# Patient Record
Sex: Male | Born: 1990 | Race: White | Hispanic: No | Marital: Single | Smoking: Current every day smoker
Health system: Southern US, Community
[De-identification: ages and names within clinical notes are randomized; demographics above are authoritative.]

---
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
## 2014-01-10 ENCOUNTER — Encounter (HOSPITAL_COMMUNITY): Payer: Self-pay | Admitting: Emergency Medicine

## 2014-01-10 ENCOUNTER — Emergency Department (HOSPITAL_COMMUNITY)
Admission: EM | Admit: 2014-01-10 | Discharge: 2014-01-10 | Disposition: A | Discharge status: Home / Self Care | Payer: Self-pay | Attending: Emergency Medicine | Admitting: Emergency Medicine

## 2014-01-10 DIAGNOSIS — Z720 Tobacco use: Secondary | ICD-10-CM | POA: Insufficient documentation

## 2014-01-10 DIAGNOSIS — K0381 Cracked tooth: Secondary | ICD-10-CM | POA: Insufficient documentation

## 2014-01-10 DIAGNOSIS — K088 Other specified disorders of teeth and supporting structures: Secondary | ICD-10-CM | POA: Insufficient documentation

## 2014-01-10 DIAGNOSIS — K0889 Other specified disorders of teeth and supporting structures: Secondary | ICD-10-CM

## 2014-01-10 MED ORDER — ACETAMINOPHEN-CODEINE #3 300-30 MG PO TABS
2.0000 | ORAL_TABLET | Freq: Once | ORAL | Status: AC
  Administered 2014-01-10: 2 via ORAL
  Filled 2014-01-10: qty 2

## 2014-01-10 MED ORDER — ACETAMINOPHEN-CODEINE #3 300-30 MG PO TABS: 1.0000 | ORAL_TABLET | Freq: Four times a day (QID) | ORAL | Status: DC | PRN

## 2014-01-10 MED ORDER — KETOROLAC TROMETHAMINE 10 MG PO TABS
10.0000 mg | ORAL_TABLET | Freq: Once | ORAL | Status: AC
  Administered 2014-01-10: 10 mg via ORAL
  Filled 2014-01-10: qty 1

## 2014-01-10 MED ORDER — IBUPROFEN 800 MG PO TABS: 800.0000 mg | ORAL_TABLET | Freq: Three times a day (TID) | ORAL | Status: AC

## 2014-01-10 MED ORDER — ONDANSETRON HCL 4 MG PO TABS
4.0000 mg | ORAL_TABLET | Freq: Once | ORAL | Status: AC
  Administered 2014-01-10: 4 mg via ORAL
  Filled 2014-01-10: qty 1

## 2014-01-10 MED ORDER — AMOXICILLIN 250 MG PO CAPS
500.0000 mg | ORAL_CAPSULE | Freq: Once | ORAL | Status: AC
  Administered 2014-01-10: 500 mg via ORAL
  Filled 2014-01-10: qty 2

## 2014-01-10 MED ORDER — AMOXICILLIN 500 MG PO CAPS: 500.0000 mg | ORAL_CAPSULE | Freq: Three times a day (TID) | ORAL | Status: DC

## 2014-01-10 NOTE — ED Notes (Signed)
Pt states he chipped a tooth a few days ago and c/o pain since then.

## 2014-01-10 NOTE — Discharge Instructions (Signed)
Dental Pain  Toothache is pain in or around a tooth. It may get worse with chewing or with cold or heat.   HOME CARE  · Your dentist may use a numbing medicine during treatment. If so, you may need to avoid eating until the medicine wears off. Ask your dentist about this.  · Only take medicine as told by your dentist or doctor.  · Avoid chewing food near the painful tooth until after all treatment is done. Ask your dentist about this.  GET HELP RIGHT AWAY IF:   · The problem gets worse or new problems appear.  · You have a fever.  · There is redness and puffiness (swelling) of the face, jaw, or neck.  · You cannot open your mouth.  · There is pain in the jaw.  · There is very bad pain that is not helped by medicine.  MAKE SURE YOU:   · Understand these instructions.  · Will watch your condition.  · Will get help right away if you are not doing well or get worse.  Document Released: 08/26/2007 Document Revised: 06/01/2011 Document Reviewed: 08/26/2007  [employer]® Patient Information ©2015 [employer]. This information is not intended to replace advice given to you by your health care provider. Make sure you discuss any questions you have with your health care provider.

## 2014-01-10 NOTE — ED Provider Notes (Signed)
CSN: [id number]     Arrival date & time 01/10/14  2140 History   First MD Initiated Contact with Patient 01/10/14 2233     Chief Complaint  Patient presents with  . Dental Pain     (Consider location/radiation/quality/duration/timing/severity/associated sxs/prior Treatment) Patient is a 23 y.o. male presenting with tooth pain. The history is provided by the patient.  Dental Pain Location:  Lower Lower teeth location:  21/LL 1st bicuspid Quality:  Aching and throbbing Severity:  Moderate Onset quality:  Gradual Duration:  2 days Timing:  Intermittent Progression:  Worsening Context: dental caries and dental fracture   Context comment:  Pt states he chipped a tooth on the left lower jaw Relieved by:  Nothing Worsened by:  Cold food/drink Associated symptoms: facial pain   Associated symptoms: no drooling, no fever and no neck pain   Risk factors: lack of dental care and smoking   Risk factors: no diabetes and no immunosuppression     History reviewed. No pertinent past medical history. History reviewed. No pertinent past surgical history. No family history on file. History  Substance Use Topics  . Smoking status: Current Every Day Smoker    Types: Cigarettes  . Smokeless tobacco: Not on file  . Alcohol Use: No    Review of Systems  Constitutional: Negative for fever and activity change.       All ROS Neg except as noted in HPI  HENT: Positive for dental problem. Negative for drooling.   Eyes: Negative for photophobia and discharge.  Respiratory: Negative for cough, shortness of breath and wheezing.   Cardiovascular: Negative for chest pain and palpitations.  Gastrointestinal: Negative for abdominal pain and blood in stool.  Genitourinary: Negative for dysuria, frequency and hematuria.  Musculoskeletal: Negative for arthralgias, back pain and neck pain.  Skin: Negative.   Neurological: Negative for dizziness, seizures and speech difficulty.  Psychiatric/Behavioral:  Negative for hallucinations and confusion.      Allergies  Review of patient's allergies indicates no known allergies.  Home Medications   Prior to Admission medications   Not on File   BP 126/57  Pulse 78  Temp(Src) 98.9 F (37.2 C) (Oral)  Resp 24  Ht 6\' 1"  (1.854 m)  Wt 180 lb (81.647 kg)  BMI 23.75 kg/m2  SpO2 99% Physical Exam  Nursing note and vitals reviewed. Constitutional: He is oriented to person, place, and time. He appears well-developed and well-nourished.  Non-toxic appearance.  HENT:  Head: Normocephalic.  Right Ear: Tympanic membrane and external ear normal.  Left Ear: Tympanic membrane and external ear normal.  Patient has multiple caries, but there is a chipped premolar of the left lower jaw. Pulp is not exposed. There is moderate swelling around the gum, but no visible abscess. There is no swelling under the tongue. The airway is patent.  Eyes: EOM and lids are normal. Pupils are equal, round, and reactive to light.  Neck: Normal range of motion. Neck supple. Carotid bruit is not present.  Cardiovascular: Normal rate, regular rhythm, normal heart sounds, intact distal pulses and normal pulses.   Pulmonary/Chest: Breath sounds normal. No respiratory distress.  Abdominal: Soft. Bowel sounds are normal. There is no tenderness. There is no guarding.  Musculoskeletal: Normal range of motion.  Lymphadenopathy:       Head (right side): No submandibular adenopathy present.       Head (left side): No submandibular adenopathy present.    He has no cervical adenopathy.  Neurological: He is alert and  oriented to person, place, and time. He has normal strength. No cranial nerve deficit or sensory deficit.  Skin: Skin is warm and dry.  Psychiatric: He has a normal mood and affect. His speech is normal.    ED Course  Procedures (including critical care time) Labs Review Labs Reviewed - No data to display  Imaging Review No results found.   EKG  Interpretation None      MDM  Pt has a chipped tooth on the left lower jaw area. Vital signs stable Rx for tylenol codeine, amoxil and ibuprofen given to the patient. Pt strongly encouraged to see a dentist as soon as possible.   Final diagnoses:  Toothache    **I have reviewed nursing notes, vital signs, and all appropriate lab and imaging results for this patient.Kathie Dike*    Rashauna Tep M Lesette Frary, PA-C 01/11/14 [phone]

## 2014-01-12 NOTE — ED Provider Notes (Signed)
Medical screening examination/treatment/procedure(s) were performed by non-physician practitioner and as supervising physician I was immediately available for consultation/collaboration.    Vida RollerBrian D Glendon Dunwoody, MD 01/12/14 [phone]

## 2014-04-24 ENCOUNTER — Encounter (HOSPITAL_COMMUNITY): Payer: Self-pay

## 2014-04-24 ENCOUNTER — Emergency Department (HOSPITAL_COMMUNITY)
Admission: EM | Admit: 2014-04-24 | Discharge: 2014-04-24 | Disposition: A | Discharge status: Home / Self Care | Payer: Self-pay | Attending: Emergency Medicine | Admitting: Emergency Medicine

## 2014-04-24 ENCOUNTER — Emergency Department (HOSPITAL_COMMUNITY): Payer: Self-pay

## 2014-04-24 DIAGNOSIS — W2201XA Walked into wall, initial encounter: Secondary | ICD-10-CM | POA: Insufficient documentation

## 2014-04-24 DIAGNOSIS — Y9389 Activity, other specified: Secondary | ICD-10-CM | POA: Insufficient documentation

## 2014-04-24 DIAGNOSIS — Y998 Other external cause status: Secondary | ICD-10-CM | POA: Insufficient documentation

## 2014-04-24 DIAGNOSIS — Z792 Long term (current) use of antibiotics: Secondary | ICD-10-CM | POA: Insufficient documentation

## 2014-04-24 DIAGNOSIS — Y9289 Other specified places as the place of occurrence of the external cause: Secondary | ICD-10-CM | POA: Insufficient documentation

## 2014-04-24 DIAGNOSIS — Z720 Tobacco use: Secondary | ICD-10-CM | POA: Insufficient documentation

## 2014-04-24 DIAGNOSIS — L03114 Cellulitis of left upper limb: Secondary | ICD-10-CM | POA: Insufficient documentation

## 2014-04-24 DIAGNOSIS — R000 Tachycardia, unspecified: Secondary | ICD-10-CM | POA: Insufficient documentation

## 2014-04-24 DIAGNOSIS — Z791 Long term (current) use of non-steroidal anti-inflammatories (NSAID): Secondary | ICD-10-CM | POA: Insufficient documentation

## 2014-04-24 MED ORDER — SULFAMETHOXAZOLE-TRIMETHOPRIM 800-160 MG PO TABS: 1.0000 | ORAL_TABLET | Freq: Two times a day (BID) | ORAL | Status: DC

## 2014-04-24 MED ORDER — KETOROLAC TROMETHAMINE 60 MG/2ML IM SOLN
60.0000 mg | Freq: Once | INTRAMUSCULAR | Status: AC
  Administered 2014-04-24: 60 mg via INTRAMUSCULAR
  Filled 2014-04-24: qty 2

## 2014-04-24 MED ORDER — NAPROXEN 500 MG PO TABS: 500.0000 mg | ORAL_TABLET | Freq: Two times a day (BID) | ORAL | Status: DC

## 2014-04-24 MED ORDER — CEPHALEXIN 500 MG PO CAPS: 500.0000 mg | ORAL_CAPSULE | Freq: Three times a day (TID) | ORAL | Status: DC
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
## 2014-04-24 NOTE — Discharge Instructions (Signed)

## 2014-04-26 ENCOUNTER — Encounter (HOSPITAL_COMMUNITY): Payer: Self-pay | Admitting: *Deleted

## 2014-04-26 ENCOUNTER — Inpatient Hospital Stay (HOSPITAL_COMMUNITY)
Admission: EM | Admit: 2014-04-26 | Discharge: 2014-04-29 | DRG: 603 | Disposition: A | Discharge status: Home / Self Care | Payer: Self-pay | Attending: Internal Medicine | Admitting: Internal Medicine

## 2014-04-26 DIAGNOSIS — Z792 Long term (current) use of antibiotics: Secondary | ICD-10-CM

## 2014-04-26 DIAGNOSIS — L03114 Cellulitis of left upper limb: Principal | ICD-10-CM | POA: Diagnosis present

## 2014-04-26 DIAGNOSIS — L02414 Cutaneous abscess of left upper limb: Secondary | ICD-10-CM | POA: Diagnosis present

## 2014-04-26 DIAGNOSIS — R000 Tachycardia, unspecified: Secondary | ICD-10-CM | POA: Diagnosis present

## 2014-04-26 DIAGNOSIS — E876 Hypokalemia: Secondary | ICD-10-CM | POA: Diagnosis present

## 2014-04-26 DIAGNOSIS — Z791 Long term (current) use of non-steroidal anti-inflammatories (NSAID): Secondary | ICD-10-CM

## 2014-04-26 DIAGNOSIS — D696 Thrombocytopenia, unspecified: Secondary | ICD-10-CM | POA: Diagnosis present

## 2014-04-26 DIAGNOSIS — F17210 Nicotine dependence, cigarettes, uncomplicated: Secondary | ICD-10-CM | POA: Diagnosis present

## 2014-04-26 DIAGNOSIS — F1910 Other psychoactive substance abuse, uncomplicated: Secondary | ICD-10-CM

## 2014-04-26 LAB — CBC WITH DIFFERENTIAL/PLATELET
Basophils Absolute: 0 10*3/uL (ref 0.0–0.1)
Basophils Relative: 0 % (ref 0–1)
Eosinophils Absolute: 0.5 10*3/uL (ref 0.0–0.7)
Eosinophils Relative: 3 % (ref 0–5)
HCT: 40.5 % (ref 39.0–52.0)
Hemoglobin: 13.8 g/dL (ref 13.0–17.0)
Lymphocytes Relative: 6 % — ABNORMAL LOW (ref 12–46)
Lymphs Abs: 0.8 10*3/uL (ref 0.7–4.0)
MCH: 30.2 pg (ref 26.0–34.0)
MCHC: 34.1 g/dL (ref 30.0–36.0)
MCV: 88.6 fL (ref 78.0–100.0)
Monocytes Absolute: 0.4 10*3/uL (ref 0.1–1.0)
Monocytes Relative: 3 % (ref 3–12)
Neutro Abs: 12.5 10*3/uL — ABNORMAL HIGH (ref 1.7–7.7)
Neutrophils Relative %: 88 % — ABNORMAL HIGH (ref 43–77)
Platelets: 149 10*3/uL — ABNORMAL LOW (ref 150–400)
RBC: 4.57 MIL/uL (ref 4.22–5.81)
RDW: 14 % (ref 11.5–15.5)
WBC: 14.2 10*3/uL — ABNORMAL HIGH (ref 4.0–10.5)

## 2014-04-26 LAB — BASIC METABOLIC PANEL
Anion gap: 8 (ref 5–15)
BUN: 18 mg/dL (ref 6–23)
CO2: 26 mmol/L (ref 19–32)
Calcium: 8.3 mg/dL — ABNORMAL LOW (ref 8.4–10.5)
Chloride: 103 mmol/L (ref 96–112)
Creatinine, Ser: 1.32 mg/dL (ref 0.50–1.35)
GFR calc Af Amer: 87 mL/min — ABNORMAL LOW (ref >90)
GFR calc non Af Amer: 75 mL/min — ABNORMAL LOW (ref >90)
Glucose, Bld: 152 mg/dL — ABNORMAL HIGH (ref 70–99)
Potassium: 3.4 mmol/L — ABNORMAL LOW (ref 3.5–5.1)
Sodium: 137 mmol/L (ref 135–145)

## 2014-04-26 MED ORDER — POVIDONE-IODINE 10 % EX SOLN
CUTANEOUS | Status: AC
  Filled 2014-04-26: qty 118

## 2014-04-26 MED ORDER — PIPERACILLIN-TAZOBACTAM 3.375 G IVPB 30 MIN
3.3750 g | Freq: Once | INTRAVENOUS | Status: AC
  Administered 2014-04-26: 3.375 g via INTRAVENOUS
  Filled 2014-04-26: qty 50

## 2014-04-26 MED ORDER — BUPIVACAINE HCL (PF) 0.25 % IJ SOLN
10.0000 mL | Freq: Once | INTRAMUSCULAR | Status: AC
  Administered 2014-04-27: 10 mL
  Filled 2014-04-26: qty 30

## 2014-04-26 MED ORDER — ONDANSETRON HCL 4 MG/2ML IJ SOLN
4.0000 mg | Freq: Once | INTRAMUSCULAR | Status: AC
  Administered 2014-04-26: 4 mg via INTRAMUSCULAR
  Filled 2014-04-26: qty 2

## 2014-04-26 MED ORDER — HYDROMORPHONE HCL 1 MG/ML IJ SOLN
1.0000 mg | Freq: Once | INTRAMUSCULAR | Status: AC
  Administered 2014-04-26: 1 mg via INTRAVENOUS
  Filled 2014-04-26: qty 1

## 2014-04-26 MED ORDER — VANCOMYCIN HCL 10 G IV SOLR
1500.0000 mg | Freq: Once | INTRAVENOUS | Status: AC
  Administered 2014-04-26: 1500 mg via INTRAVENOUS
  Filled 2014-04-26: qty 1500

## 2014-04-26 NOTE — ED Notes (Signed)
MD at bedside. 
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
## 2014-04-27 ENCOUNTER — Encounter (HOSPITAL_COMMUNITY): Payer: Self-pay | Admitting: *Deleted

## 2014-04-27 DIAGNOSIS — L03113 Cellulitis of right upper limb: Secondary | ICD-10-CM | POA: Insufficient documentation

## 2014-04-27 DIAGNOSIS — F1910 Other psychoactive substance abuse, uncomplicated: Secondary | ICD-10-CM | POA: Diagnosis present

## 2014-04-27 DIAGNOSIS — L0291 Cutaneous abscess, unspecified: Secondary | ICD-10-CM | POA: Insufficient documentation

## 2014-04-27 DIAGNOSIS — L0390 Cellulitis, unspecified: Secondary | ICD-10-CM

## 2014-04-27 DIAGNOSIS — L02414 Cutaneous abscess of left upper limb: Secondary | ICD-10-CM

## 2014-04-27 DIAGNOSIS — L03114 Cellulitis of left upper limb: Secondary | ICD-10-CM | POA: Diagnosis present

## 2014-04-27 DIAGNOSIS — R7881 Bacteremia: Secondary | ICD-10-CM

## 2014-04-27 LAB — HEPATIC FUNCTION PANEL
ALBUMIN: 2.8 g/dL — AB (ref 3.5–5.2)
ALT: 45 U/L (ref 0–53)
AST: 28 U/L (ref 0–37)
Alkaline Phosphatase: 87 U/L (ref 39–117)
Bilirubin, Direct: 0.2 mg/dL (ref 0.0–0.5)
Indirect Bilirubin: 0.2 mg/dL — ABNORMAL LOW (ref 0.3–0.9)
Total Bilirubin: 0.4 mg/dL (ref 0.3–1.2)
Total Protein: 6 g/dL (ref 6.0–8.3)

## 2014-04-27 LAB — CBC
HCT: 34.7 % — ABNORMAL LOW (ref 39.0–52.0)
HEMOGLOBIN: 11.9 g/dL — AB (ref 13.0–17.0)
MCH: 30.4 pg (ref 26.0–34.0)
MCHC: 34.3 g/dL (ref 30.0–36.0)
MCV: 88.5 fL (ref 78.0–100.0)
PLATELETS: 143 10*3/uL — AB (ref 150–400)
RBC: 3.92 MIL/uL — ABNORMAL LOW (ref 4.22–5.81)
RDW: 14 % (ref 11.5–15.5)
WBC: 11.5 10*3/uL — AB (ref 4.0–10.5)

## 2014-04-27 LAB — BASIC METABOLIC PANEL
Anion gap: 5 (ref 5–15)
BUN: 16 mg/dL (ref 6–23)
CALCIUM: 7.9 mg/dL — AB (ref 8.4–10.5)
CO2: 24 mmol/L (ref 19–32)
Chloride: 103 mmol/L (ref 96–112)
Creatinine, Ser: 1.2 mg/dL (ref 0.50–1.35)
GFR calc Af Amer: >90 mL/min (ref >90)
GFR calc non Af Amer: 84 mL/min — ABNORMAL LOW (ref >90)
Glucose, Bld: 135 mg/dL — ABNORMAL HIGH (ref 70–99)
Potassium: 3.1 mmol/L — ABNORMAL LOW (ref 3.5–5.1)
SODIUM: 132 mmol/L — AB (ref 135–145)

## 2014-04-27 MED ORDER — SODIUM CHLORIDE 0.9 % IV SOLN
250.0000 mL | INTRAVENOUS | Status: DC | PRN
  Administered 2014-04-27: 250 mL via INTRAVENOUS

## 2014-04-27 MED ORDER — VANCOMYCIN HCL IN DEXTROSE 750-5 MG/150ML-% IV SOLN
750.0000 mg | Freq: Once | INTRAVENOUS | Status: DC
  Filled 2014-04-27: qty 150

## 2014-04-27 MED ORDER — HYDROMORPHONE HCL 2 MG/ML IJ SOLN
2.0000 mg | Freq: Once | INTRAMUSCULAR | Status: AC
  Administered 2014-04-27: 2 mg via INTRAVENOUS
  Filled 2014-04-27: qty 1

## 2014-04-27 MED ORDER — ONDANSETRON HCL 4 MG/2ML IJ SOLN: 4.0000 mg | Freq: Four times a day (QID) | INTRAMUSCULAR | Status: DC | PRN

## 2014-04-27 MED ORDER — PIPERACILLIN-TAZOBACTAM 3.375 G IVPB
3.3750 g | Freq: Three times a day (TID) | INTRAVENOUS | Status: DC
  Administered 2014-04-27 – 2014-04-29 (×7): 3.375 g via INTRAVENOUS
  Filled 2014-04-27 (×11): qty 50

## 2014-04-27 MED ORDER — SODIUM CHLORIDE 0.9 % IJ SOLN
3.0000 mL | Freq: Two times a day (BID) | INTRAMUSCULAR | Status: DC
  Administered 2014-04-27 – 2014-04-28 (×3): 3 mL via INTRAVENOUS

## 2014-04-27 MED ORDER — SODIUM CHLORIDE 0.9 % IJ SOLN
3.0000 mL | INTRAMUSCULAR | Status: DC | PRN
  Administered 2014-04-27: 3 mL via INTRAVENOUS
  Filled 2014-04-27: qty 3

## 2014-04-27 MED ORDER — ONDANSETRON HCL 4 MG PO TABS: 4.0000 mg | ORAL_TABLET | Freq: Four times a day (QID) | ORAL | Status: DC | PRN

## 2014-04-27 MED ORDER — ACETAMINOPHEN 325 MG PO TABS
650.0000 mg | ORAL_TABLET | ORAL | Status: DC | PRN
  Administered 2014-04-27 – 2014-04-28 (×4): 650 mg via ORAL
  Filled 2014-04-27 (×3): qty 2

## 2014-04-27 MED ORDER — VANCOMYCIN HCL IN DEXTROSE 1-5 GM/200ML-% IV SOLN
1000.0000 mg | Freq: Two times a day (BID) | INTRAVENOUS | Status: DC
  Administered 2014-04-27 – 2014-04-29 (×4): 1000 mg via INTRAVENOUS
  Filled 2014-04-27 (×7): qty 200

## 2014-04-27 MED ORDER — ENOXAPARIN SODIUM 40 MG/0.4ML ~~LOC~~ SOLN
40.0000 mg | SUBCUTANEOUS | Status: DC
  Filled 2014-04-27 (×2): qty 0.4

## 2014-04-27 MED ORDER — HYDROMORPHONE HCL 1 MG/ML IJ SOLN
1.0000 mg | INTRAMUSCULAR | Status: DC | PRN
  Administered 2014-04-27 – 2014-04-29 (×17): 1 mg via INTRAVENOUS
  Filled 2014-04-27 (×18): qty 1

## 2014-04-27 MED ORDER — PIPERACILLIN-TAZOBACTAM 3.375 G IVPB
INTRAVENOUS | Status: AC
  Filled 2014-04-27: qty 50

## 2014-04-27 MED ORDER — POTASSIUM CHLORIDE CRYS ER 20 MEQ PO TBCR
40.0000 meq | EXTENDED_RELEASE_TABLET | Freq: Once | ORAL | Status: AC
  Administered 2014-04-27: 40 meq via ORAL
  Filled 2014-04-27: qty 2

## 2014-04-27 NOTE — Care Management Note (Signed)
    Page 1 of 1   04/27/2014     12:56:53 PM CARE MANAGEMENT NOTE 04/27/2014  Patient:  Isac SarnaOTTS,Wacey D   Account Number:  [id number]  Date Initiated:  04/27/2014  Documentation initiated by:  Kathyrn SheriffHILDRESS,JESSICA  Subjective/Objective Assessment:   Pt is from home, admited with cellulitis. Pt is uninsured and [facility]. Pt plans to discharge home with self care. Follow up appontment made at the [facility].     Action/Plan:   Referral made to financial counselor. Pt states he does not need medication assistance.  No further CM needs at this time.   Anticipated DC Date:  04/30/2014   Anticipated DC Plan:  HOME/SELF CARE  In-house referral  Financial Counselor      DC Planning Services  CM consult      Choice offered to / List presented to:             Status of service:  Completed, signed off Medicare Important Message given?   (If response is "NO", the following Medicare IM given date fields will be blank) Date Medicare IM given:   Medicare IM given by:   Date Additional Medicare IM given:   Additional Medicare IM given by:    Discharge Disposition:  HOME/SELF CARE  Per UR Regulation:    If discussed at Long Length of Stay Meetings, dates discussed:    Comments:  04/27/2014 1245 Kathyrn SheriffJessica Childress, RN, MSN, CM

## 2014-04-27 NOTE — H&P (Signed)
PCP:   No PCP Per Patient   Chief Complaint:  Left hand swelling  HPI: 24 yo male h/o iv drug abuse sober for past year comes in after hitting the wall with his left hand 2 days ago, now it is swelling left hand and forearm and very red and painful.  No fevers.  Had an abrasion over his 5th knuckle from hitting the wall.  Has cellulitis and absess on the forearm which has been drained by ER doctor.  Was seen about a day ago and placed on bactrim and keflex, but has worsened despite oral abx.  Review of Systems:  Positive and negative as per HPI otherwise all other systems are negative  Past Medical History: History reviewed. No pertinent past medical history. History reviewed. No pertinent past surgical history.  Medications: Prior to Admission medications   Medication Sig Start Date End Date Taking? Authorizing Provider  cephALEXin (KEFLEX) 500 MG capsule Take 1 capsule (500 mg total) by mouth 3 (three) times daily. 04/24/14  Yes Arthor Captain, PA-C  ibuprofen (ADVIL,MOTRIN) 800 MG tablet Take 1 tablet (800 mg total) by mouth 3 (three) times daily. 01/10/14  Yes Kathie Dike, PA-C  naproxen (NAPROSYN) 500 MG tablet Take 1 tablet (500 mg total) by mouth 2 (two) times daily with a meal. 04/24/14  Yes Arthor Captain, PA-C  sulfamethoxazole-trimethoprim (SEPTRA DS) 800-160 MG per tablet Take 1 tablet by mouth every 12 (twelve) hours. 04/24/14  Yes Arthor Captain, PA-C  acetaminophen-codeine (TYLENOL #3) 300-30 MG per tablet Take 1-2 tablets by mouth every 6 (six) hours as needed. Patient not taking: Reported on 04/24/2014 01/10/14   Kathie Dike, PA-C  amoxicillin (AMOXIL) 500 MG capsule Take 1 capsule (500 mg total) by mouth 3 (three) times daily. Patient not taking: Reported on 04/24/2014 01/10/14   Kathie Dike, PA-C    Allergies:   Allergies  Allergen Reactions  . Hydrocodone Nausea Only and Rash  . Tramadol Nausea Only and Rash    Social History:  reports that he has been  smoking Cigarettes.  He does not have any smokeless tobacco history on file. He reports that he does not drink alcohol or use illicit drugs.  Family History: History reviewed. No pertinent family history.  Physical Exam: Filed Vitals:   04/26/14 1628 04/26/14 1916 04/26/14 2349  BP: 134/83 125/67 125/75  Pulse: 120 107 113  Temp: 98.8 F (37.1 C) 100.1 F (37.8 C) 99 F (37.2 C)  TempSrc: Oral Oral Oral  Resp: Height:  (1.854 m)    Weight: 81.647 kg (180 lb)    SpO2: 100% 100% 98%   General appearance: alert, cooperative and no distress Head: Normocephalic, without obvious abnormality, atraumatic Eyes: negative Nose: Nares normal. Septum midline. Mucosa normal. No drainage or sinus tenderness. Neck: no JVD and supple, symmetrical, trachea midline Lungs: clear to auscultation bilaterally Heart: regular rate and rhythm, S1, S2 normal, no murmur, click, rub or gallop Abdomen: soft, non-tender; bowel sounds normal; no masses,  no organomegaly Extremities: extremities normal, atraumatic, no cyanosis or edema  Left hand swollen and forearm swollen with erythema c/w cellulitis, absess to mid forearm drained Pulses: 2+ and symmetric Skin: Skin color, texture, turgor normal. No rashes or lesions Neurologic: Grossly normal    Labs on Admission:   Recent Labs  04/26/14 1637  NA 137  K 3.4*  CL 103  CO2 26  GLUCOSE 152*  BUN 18  CREATININE 1.32  CALCIUM 8.3*  Recent Labs  04/26/14 1637  WBC 14.2*  NEUTROABS 12.5*  HGB 13.8  HCT 40.5  MCV 88.6  PLT 149*    Radiological Exams on Admission: Dg Forearm Left  04/24/2014   CLINICAL DATA:  Left wrist injury, punched a brick wall last night  EXAM: LEFT FOREARM - 2 VIEW  COMPARISON:  None.  FINDINGS: Two views of the left forearm submitted. No acute fracture or subluxation. No radiopaque foreign body.  IMPRESSION: Negative.   Electronically Signed   By: Natasha MeadLiviu  Pop M.D.   On: 04/24/2014 11:49   Dg Wrist  Complete Left  04/24/2014   CLINICAL DATA:  Wrist injury, punched a brick wall last night  EXAM: LEFT WRIST - COMPLETE 3+ VIEW  COMPARISON:  None.  FINDINGS: Four views of the left wrist submitted. No acute fracture or subluxation. No radiopaque foreign body. Probable old fracture of the tip of ulnar styloid.  IMPRESSION: No acute fracture or subluxation.  No radiopaque foreign body.   Electronically Signed   By: Natasha MeadLiviu  Pop M.D.   On: 04/24/2014 11:49    Assessment/Plan  24 yo male with left hand/forearm cellulitis/absess  Principal Problem:   Abscess of forearm, left-  Iv vanco and zosyn.  Wound cx sent by dr Juleen Chinakohut.  Should improve over next 24 to 48 hours with iv abx.  Prn pain meds as needed.  Active Problems:   h/o Drug abuse, IV-  Denies any recent ivda for a year.   Cellulitis of hand, left   Cellulitis of forearm, left  Admit to med surg.  Full code.  Kammy Klett A 04/27/2014, 1:27 AM

## 2014-04-27 NOTE — Progress Notes (Signed)
Pt requesting current dressing be changed to R arm wound.  Michael ShepherdM Lynch NP notified with no current orders in place for wound care.  Order placed for Wet to dry dressing changes BID.  Will change dressing per order.

## 2014-04-27 NOTE — Plan of Care (Signed)
Problem: Consults Goal: General Medical Patient Education See Patient Education Module for specific education. Outcome: Progressing Admitted with erythema and swelling to left forearm.  Penmarked and I&D'd in the ED.  Small gauze to site. Hand is also swollen and red and warm. Patient has temperature of 100.2 Goal: Skin Care Protocol Initiated - if Braden Score 18 or less If consults are not indicated, leave blank or document N/A Outcome: Progressing No drainage noted at gauze site, purulent drainage per report from ED, none noted  Problem: Phase I Progression Outcomes Goal: Pain controlled with appropriate interventions Outcome: Progressing Pain controlled with dilaudid IV Goal: OOB as tolerated unless otherwise ordered Outcome: Progressing Patient is oob and using the bathroom prn Goal: Initial discharge plan identified Outcome: Progressing [place]

## 2014-04-27 NOTE — Care Management Utilization Note (Signed)
UR completed 

## 2014-04-27 NOTE — Progress Notes (Signed)
  Echocardiogram 2D Echocardiogram has been performed.  Leta JunglingCooper, Cortland Crehan M 04/27/2014, 2:47 PM

## 2014-04-27 NOTE — Progress Notes (Addendum)
ANTIBIOTIC CONSULT NOTE - INITIAL  Pharmacy Consult for vancomycin & Zosyn Indication: cellulitis of left hand and left forearm  Allergies  Allergen Reactions  . Hydrocodone Nausea Only and Rash  . Tramadol Nausea Only and Rash    Patient Measurements: Height: 6\' 1"  (185.4 cm) Weight: 179 lb 14.3 oz (81.6 kg) IBW/kg (Calculated) : 79.9   Vital Signs: Temp: 100.2 F (37.9 C) (02/05 0148) Temp Source: Oral (02/05 0148) BP: 125/70 mmHg (02/05 0148) Pulse Rate: 112 (02/05 0148) Intake/Output from previous day:   Intake/Output from this shift:    Labs:  Recent Labs  04/26/14 1637  WBC 14.2*  HGB 13.8  PLT 149*  CREATININE 1.32   Estimated Creatinine Clearance: 98.4 mL/min (by C-G formula based on Cr of 1.32). No results for input(s): VANCOTROUGH, VANCOPEAK, VANCORANDOM, GENTTROUGH, GENTPEAK, GENTRANDOM, TOBRATROUGH, TOBRAPEAK, TOBRARND, AMIKACINPEAK, AMIKACINTROU, AMIKACIN in the last 72 hours.   Microbiology: No results found for this or any previous visit (from the past 720 hour(s)).  Medical History: History reviewed. No pertinent past medical history.  Medications:  Scheduled:  . enoxaparin (LOVENOX) injection  40 mg Subcutaneous Q24H  . piperacillin-tazobactam (ZOSYN)  IV  3.375 g Intravenous 3 times per day  . povidone-iodine      . sodium chloride  3 mL Intravenous Q12H  . vancomycin  1,000 mg Intravenous Q12H  . vancomycin  750 mg Intravenous Once   Infusions:   PRN: sodium chloride, HYDROmorphone (DILAUDID) injection, ondansetron **OR** ondansetron (ZOFRAN) IV, sodium chloride   Assessment: Young male treated for wound & cellulitis of left hand & forearm with Keflex since Feb 2nd.  Now to be treated with IV vancomycin & Zosyn.  Goal of Therapy:  Standard regimen for Zosyn with CrCl>3620ml/min.  Desire vancomycin trough level to be 12-9515mcg/ml for cellulitis.  Plan:  1.  Vancomycin 750mg  IV x 1 more dose now (in addition to 1gm dose given 2300 last  evening 2. Then maintenance regimen vancomycin 1gm IV q12h 3.  Zosyn 3.375gm IV q8h (4hr infusions) 4.  Monitor for indices of infection and renal function 5.  Measure actual vancomycin steady state trough level as clinically indicated  Tiler Brandis, Lloyd HugerNeil E 04/27/2014,1:54 AM   Addendum: Noted that patient actually received Vancomycin 1500mg  IV x 1 earlier this evening.  Therefore, will not give the 750mg  additional loading dose.

## 2014-04-27 NOTE — Progress Notes (Signed)
[facility] PROGRESS NOTE  Michael Benitez ZOX:[id number] DOB: 02-03-1991 DOA: 04/26/2014 PCP: No PCP Per Patient Interim summary: 24 yo male with prior h/o IV drug abuse, comes in for left forearm and hand swelling. He was seen by EDP a day ago and started on keflex and bactrim,. His symptoms have worsened since then and he was back last night for the same symptoms. He was admitted for left forearm cellulitis and abscess. He underwent I&D of the left forearm and started on broad spectrum antibiotics.  Currently he is febrile and tachycardic.   Assessment/Plan: 1. Left arm cellulitis and abscess s/p I& D by EDP and wound cultures sent. Currently on IV vancomycin and Iv ZOSYN. Pain control with dilaudid .   Hypokalemia: replete as needed.   Mild leukocytosis: From the left arm cellulitis .   Mild thrombocytopenia.   Tachycardia: Possibly from the pain and infection.    Code Status: full code.  Family Communication: family at bedside Disposition Plan: home when the cellulitis improves   Consultants: ? Surgery by EDP Procedures:  I&d 2/4  Antibiotics: IV vancomycin  IV zosyn 2/4 HPI/Subjective: Wants to know when he can go home. Reports pain is not well controlled.   Objective: Filed Vitals:   04/27/14 0806  BP: 111/45  Pulse: 129  Temp: 102.3 F (39.1 C)  Resp:    No intake or output data in the 24 hours ending 04/27/14 1009 Filed Weights   04/26/14 1628 04/27/14 0148  Weight: 81.647 kg (180 lb) 81.6 kg (179 lb 14.3 oz)    Exam:   General:  Alert, febrile in mild distress from pain  Cardiovascular: s1s2, tachycardic. No mrg.   Respiratory: clear to ausculation, no wheezing heard  Abdomen: soft non tender not distended bowel sounds heard  Musculoskeletal: left fore arm and hand swollen and erythematous.   Data Reviewed: Basic Metabolic Panel:  Recent Labs Lab 04/26/14 1637 04/27/14 0611  NA 137 132*  K 3.4* 3.1*  CL 103 103  CO2 26 24   GLUCOSE 152* 135*  BUN 18 16  CREATININE 1.32 1.20  CALCIUM 8.3* 7.9*   Liver Function Tests: No results for input(s): AST, ALT, ALKPHOS, BILITOT, PROT, ALBUMIN in the last 168 hours. No results for input(s): LIPASE, AMYLASE in the last 168 hours. No results for input(s): AMMONIA in the last 168 hours. CBC:  Recent Labs Lab 04/26/14 1637 04/27/14 0611  WBC 14.2* 11.5*  NEUTROABS 12.5*  --   HGB 13.8 11.9*  HCT 40.5 34.7*  MCV 88.6 88.5  PLT 149* 143*   Cardiac Enzymes: No results for input(s): CKTOTAL, CKMB, CKMBINDEX, TROPONINI in the last 168 hours. BNP (last 3 results) No results for input(s): BNP in the last 8760 hours.  ProBNP (last 3 results) No results for input(s): PROBNP in the last 8760 hours.  CBG: No results for input(s): GLUCAP in the last 168 hours.  No results found for this or any previous visit (from the past 240 hour(s)).   Studies: No results found.  Scheduled Meds: . enoxaparin (LOVENOX) injection  40 mg Subcutaneous Q24H  . piperacillin-tazobactam (ZOSYN)  IV  3.375 g Intravenous 3 times per day  . povidone-iodine      . sodium chloride  3 mL Intravenous Q12H  . vancomycin  1,000 mg Intravenous Q12H   Continuous Infusions:   Principal Problem:   Abscess of forearm, left Active Problems:   h/o Drug abuse, IV   Cellulitis of hand, left  Cellulitis of forearm, left    Time spent: 20 minutes    Rajat Staver  [facility] Pager [phone] If 7PM-7AM, please contact night-coverage at [email or web address], password [email or web address] 04/27/2014, 10:09 AM  LOS: 1 day

## 2014-04-28 LAB — BASIC METABOLIC PANEL
Anion gap: 7 (ref 5–15)
BUN: 15 mg/dL (ref 6–23)
CO2: 24 mmol/L (ref 19–32)
Calcium: 8.2 mg/dL — ABNORMAL LOW (ref 8.4–10.5)
Chloride: 103 mmol/L (ref 96–112)
Creatinine, Ser: 1 mg/dL (ref 0.50–1.35)
GFR calc Af Amer: >90 mL/min (ref >90)
GFR calc non Af Amer: >90 mL/min (ref >90)
Glucose, Bld: 108 mg/dL — ABNORMAL HIGH (ref 70–99)
Potassium: 3.3 mmol/L — ABNORMAL LOW (ref 3.5–5.1)
Sodium: 134 mmol/L — ABNORMAL LOW (ref 135–145)

## 2014-04-28 LAB — CBC
HEMATOCRIT: 33.9 % — AB (ref 39.0–52.0)
HEMOGLOBIN: 11.4 g/dL — AB (ref 13.0–17.0)
MCH: 30.2 pg (ref 26.0–34.0)
MCHC: 33.6 g/dL (ref 30.0–36.0)
MCV: 89.7 fL (ref 78.0–100.0)
Platelets: 162 10*3/uL (ref 150–400)
RBC: 3.78 MIL/uL — ABNORMAL LOW (ref 4.22–5.81)
RDW: 14.2 % (ref 11.5–15.5)
WBC: 9.5 10*3/uL (ref 4.0–10.5)

## 2014-04-28 MED ORDER — POTASSIUM CHLORIDE CRYS ER 20 MEQ PO TBCR
40.0000 meq | EXTENDED_RELEASE_TABLET | Freq: Two times a day (BID) | ORAL | Status: AC
  Administered 2014-04-28 (×2): 40 meq via ORAL
  Filled 2014-04-28 (×2): qty 2

## 2014-04-28 MED ORDER — OXYCODONE HCL 5 MG PO TABS
5.0000 mg | ORAL_TABLET | Freq: Four times a day (QID) | ORAL | Status: DC | PRN
  Administered 2014-04-29 (×2): 5 mg via ORAL
  Filled 2014-04-28 (×2): qty 1

## 2014-04-28 NOTE — Plan of Care (Signed)
Problem: Phase II Progression Outcomes Goal: Progress activity as tolerated unless otherwise ordered Outcome: Completed/Met Date Met:  04/28/14 Pt ambulating [place] tonight with steady gate Goal: IV changed to normal saline lock Outcome: Not Progressing Pt on IV antibiotics at this time

## 2014-04-28 NOTE — Progress Notes (Signed)
[facility] PROGRESS NOTE  Isac SarnaCody D Sahakian XBJ:[id number] DOB: 11/14/1990 DOA: 04/26/2014 PCP: No PCP Per Patient Interim summary: 24 yo male with prior h/o IV drug abuse, comes in for left forearm and hand swelling. He was seen by EDP a day ago and started on keflex and bactrim,. His symptoms have worsened since then and he was back last night for the same symptoms. He was admitted for left forearm cellulitis and abscess. He underwent I&D of the left forearm and started on broad spectrum antibiotics.    Assessment/Plan: 1. Left arm cellulitis and abscess s/p I& D by EDP and wound cultures sent. Currently on IV vancomycin and Iv ZOSYN. Pain control with dilaudid .  Erythema is Slightly improved.   Hypokalemia: replete as needed.   Mild leukocytosis: From the left arm cellulitis .   Mild thrombocytopenia.   Tachycardia: Possibly from the pain and infection. Improved.    Code Status: full code.  Family Communication: none at bedside Disposition Plan: home when the cellulitis improves   Consultants: ? Surgery by EDP Procedures:  I&d 2/4  Antibiotics: IV vancomycin  IV zosyn 2/4 HPI/Subjective: Wants to know when he can go home.  Pain is better  Objective: Filed Vitals:   04/28/14 1437  BP: 114/61  Pulse: 74  Temp: 98.9 F (37.2 C)  Resp: 18    Intake/Output Summary (Last 24 hours) at 04/28/14 1453 Last data filed at 04/28/14 1437  Gross per 24 hour  Intake   1480 ml  Output      3 ml  Net   1477 ml   Filed Weights   04/26/14 1628 04/27/14 0148  Weight: 81.647 kg (180 lb) 81.6 kg (179 lb 14.3 oz)    Exam:   General:  Alert, febrile in mild distress from pain  Cardiovascular: s1s2, tachycardic. No mrg.   Respiratory: clear to ausculation, no wheezing heard  Abdomen: soft non tender not distended bowel sounds heard  Musculoskeletal: left fore arm and hand swollen and erythematous. Slightly improved from yesterday.   Data Reviewed: Basic Metabolic  Panel:  Recent Labs Lab 04/26/14 1637 04/27/14 0611 04/28/14 0614  NA 137 132* 134*  K 3.4* 3.1* 3.3*  CL 103 103 103  CO2 26 24 24   GLUCOSE 152* 135* 108*  BUN 18 16 15   CREATININE 1.32 1.20 1.00  CALCIUM 8.3* 7.9* 8.2*   Liver Function Tests:  Recent Labs Lab 04/27/14 0607  AST 28  ALT 45  ALKPHOS 87  BILITOT 0.4  PROT 6.0  ALBUMIN 2.8*   No results for input(s): LIPASE, AMYLASE in the last 168 hours. No results for input(s): AMMONIA in the last 168 hours. CBC:  Recent Labs Lab 04/26/14 1637 04/27/14 0611 04/28/14 0614  WBC 14.2* 11.5* 9.5  NEUTROABS 12.5*  --   --   HGB 13.8 11.9* 11.4*  HCT 40.5 34.7* 33.9*  MCV 88.6 88.5 89.7  PLT 149* 143* 162   Cardiac Enzymes: No results for input(s): CKTOTAL, CKMB, CKMBINDEX, TROPONINI in the last 168 hours. BNP (last 3 results) No results for input(s): BNP in the last 8760 hours.  ProBNP (last 3 results) No results for input(s): PROBNP in the last 8760 hours.  CBG: No results for input(s): GLUCAP in the last 168 hours.  No results found for this or any previous visit (from the past 240 hour(s)).   Studies: No results found.  Scheduled Meds: . enoxaparin (LOVENOX) injection  40 mg Subcutaneous Q24H  . piperacillin-tazobactam (ZOSYN)  IV  3.375 g Intravenous 3 times per day  . potassium chloride  40 mEq Oral BID  . sodium chloride  3 mL Intravenous Q12H  . vancomycin  1,000 mg Intravenous Q12H   Continuous Infusions:   Principal Problem:   Abscess of forearm, left Active Problems:   h/o Drug abuse, IV   Cellulitis of hand, left   Cellulitis of forearm, left    Time spent: 20 minutes    Sereen Schaff  [facility] Pager [phone] If 7PM-7AM, please contact night-coverage at [email or web address], password [facility] 04/28/2014, 2:53 PM  LOS: 2 days

## 2014-04-29 LAB — BASIC METABOLIC PANEL
Anion gap: 7 (ref 5–15)
BUN: 12 mg/dL (ref 6–23)
CO2: 26 mmol/L (ref 19–32)
Calcium: 8.2 mg/dL — ABNORMAL LOW (ref 8.4–10.5)
Chloride: 103 mmol/L (ref 96–112)
Creatinine, Ser: 0.83 mg/dL (ref 0.50–1.35)
GFR calc Af Amer: >90 mL/min (ref >90)
GLUCOSE: 102 mg/dL — AB (ref 70–99)
Potassium: 3.5 mmol/L (ref 3.5–5.1)
Sodium: 136 mmol/L (ref 135–145)

## 2014-04-29 LAB — VANCOMYCIN, TROUGH: Vancomycin Tr: 3.7 ug/mL — ABNORMAL LOW (ref 10.0–20.0)

## 2014-04-29 MED ORDER — OXYCODONE HCL 5 MG PO TABS: 5.0000 mg | ORAL_TABLET | Freq: Four times a day (QID) | ORAL | Status: AC | PRN

## 2014-04-29 MED ORDER — LEVOFLOXACIN 500 MG PO TABS: 500.0000 mg | ORAL_TABLET | Freq: Every day | ORAL | Status: AC

## 2014-04-29 MED ORDER — VANCOMYCIN HCL IN DEXTROSE 1-5 GM/200ML-% IV SOLN
1000.0000 mg | Freq: Three times a day (TID) | INTRAVENOUS | Status: DC
  Administered 2014-04-29: 1000 mg via INTRAVENOUS
  Filled 2014-04-29 (×4): qty 200

## 2014-04-29 MED ORDER — CLINDAMYCIN HCL 300 MG PO CAPS: 300.0000 mg | ORAL_CAPSULE | Freq: Three times a day (TID) | ORAL | Status: AC

## 2014-04-29 NOTE — Progress Notes (Signed)
Late Entry  IV removed and site within normal limits.AVS reviewed with patient. Verbalized understanding of instructions, medications, and physician follow ups. Prescriptions given to patient. Pt transported to main entrance by nurse. Stable at time of discharge.

## 2014-04-29 NOTE — Progress Notes (Signed)
ANTIBIOTIC CONSULT NOTE  Pharmacy Consult for Vancomycin & Zosyn Indication: cellulitis of left hand and left forearm  Allergies  Allergen Reactions  . Toradol [Ketorolac Tromethamine]     [facility] pain at injection site with Toradol as he reacted with vicodin and tramadol.  Placed on allergy list  . Hydrocodone Nausea Only and Rash  . Tramadol Nausea Only and Rash    Patient Measurements: Height: 6\' 1"  (185.4 cm) Weight: 179 lb 14.3 oz (81.6 kg) IBW/kg (Calculated) : 79.9   Vital Signs: Temp: 98.7 F (37.1 C) (02/07 0616) Temp Source: Oral (02/07 0616) BP: 117/61 mmHg (02/07 0616) Pulse Rate: 90 (02/07 0616) Intake/Output from previous day: 02/06 0701 - 02/07 0700 In: 1790 [P.O.:1420; I.V.:120; IV Piggyback:250] Out: 3 [Urine:3] Intake/Output from this shift:    Labs:  Recent Labs  04/26/14 1637 04/27/14 0611 04/28/14 0614  WBC 14.2* 11.5* 9.5  HGB 13.8 11.9* 11.4*  PLT 149* 143* 162  CREATININE 1.32 1.20 1.00   Estimated Creatinine Clearance: 129.8 mL/min (by C-G formula based on Cr of 1).  Recent Labs  04/28/14 2353  VANCOTROUGH 3.7*     Microbiology: No results found for this or any previous visit (from the past 720 hour(s)).  Medical History: History reviewed. No pertinent past medical history.  Medications:  Scheduled:  . enoxaparin (LOVENOX) injection  40 mg Subcutaneous Q24H  . piperacillin-tazobactam (ZOSYN)  IV  3.375 g Intravenous 3 times per day  . sodium chloride  3 mL Intravenous Q12H  . vancomycin  1,000 mg Intravenous Q8H   Infusions:   PRN: sodium chloride, acetaminophen, HYDROmorphone (DILAUDID) injection, ondansetron **OR** ondansetron (ZOFRAN) IV, oxyCODONE, sodium chloride   Assessment: 24 yoM with wound & cellulitis of left hand & forearm.  He has been on Keflex & Bactrim since Feb 2nd as an outpatient with no improvement.  Admitted for broad-spectrum antibiotics with IV Vancomycin &  Zosyn. He is afebrile & leukocytosis has improved.  Some improvement in erythema per MD note. S/P I&D in ED- however do not see any cx pending in computer.  Excellent renal function.  Estimated CrCl >18000ml/min.  Vancomycin trough below desired goal range.   Vancomycin 2/5>> Zosyn 2/5>>  Goal of Therapy:  Vancomycin trough 10-3015mcg/ml   Plan:  Increase Vancomycin 1gm IV q8h Continue Zosyn 3.375gm IV q8h (4hr infusions) Repeat Vancomycin trough at steady state if plan to continue Monitor renal function and cx data   Elson ClanLilliston, Evalynn Hankins Michelle 04/29/2014,8:30 AM

## 2014-04-29 NOTE — Discharge Summary (Signed)
Physician Discharge Summary  Michael Benitez:[id number]:[id number] DOB: 11/10/1990 DOA: 04/26/2014  PCP: No PCP Per Patient  Admit date: 04/26/2014 Discharge date: 04/29/2014  Time spent: 30 minutes  Recommendations for Outpatient Follow-up:  1. Follow up with PCP in one week.   Discharge Diagnoses:  Principal Problem:   Abscess of forearm, left Active Problems:   h/o Drug abuse, IV   Cellulitis of hand, left   Cellulitis of forearm, left   Discharge Condition: improved  Diet recommendation: regular  Filed Weights   04/26/14 1628 04/27/14 0148  Weight: 81.647 kg (180 lb) 81.6 kg (179 lb 14.3 oz)    History of present illness:  24 yo male with prior h/o IV drug abuse, comes in for left forearm and hand swelling. He was seen by EDP a day ago and started on keflex and bactrim,. His symptoms have worsened since then and he was back last night for the same symptoms. He was admitted for left forearm cellulitis and abscess. He underwent I&D of the left forearm and started on broad spectrum antibiotics.   Hospital Course:  1. Left arm cellulitis and abscess s/p I& D by EDP and wound cultures sent. Currently on IV vancomycin and Iv ZOSYN. Pain control with dilaudid . Erythema is Slightly improved. Patient reported he wanted to leave AMA as he has to go to [place]. He was discharged on oral antibiotics. Recommend outpatient follow up with PCP.   Hypokalemia: replete as needed.   Mild leukocytosis: From the left arm cellulitis . Improving.   Mild thrombocytopenia.   Tachycardia: Possibly from the pain and infection. Improved.   Procedures: 2. I&d 2/4  Consultations:  none  Discharge Exam: Filed Vitals:   04/29/14 0616  BP: 117/61  Pulse: 90  Temp: 98.7 F (37.1 C)  Resp: 18    General: alert afebrile comfortable Cardiovascular: s1s2 Respiratory: ctab  Discharge Instructions   Discharge Instructions    Call MD for:  persistant dizziness or light-headedness    Complete by:  As  directed      Call MD for:  redness, tenderness, or signs of infection (pain, swelling, redness, odor or green/yellow discharge around incision site)    Complete by:  As directed      Call MD for:  temperature >100.4    Complete by:  As directed      Discharge instructions    Complete by:  As directed   Please follow up with PCP in one week, please go to ED, if the swelling and redness does not improve in 1 to 2 days. Please complete the course of the antibiotics.          Current Discharge Medication List    START taking these medications   Details  clindamycin (CLEOCIN) 300 MG capsule Take 1 capsule (300 mg total) by mouth 3 (three) times daily. Qty: 21 capsule, Refills: 0    levofloxacin (LEVAQUIN) 500 MG tablet Take 1 tablet (500 mg total) by mouth daily. Qty: 7 tablet, Refills: 0      CONTINUE these medications which have NOT CHANGED   Details  ibuprofen (ADVIL,MOTRIN) 800 MG tablet Take 1 tablet (800 mg total) by mouth 3 (three) times daily. Qty: 21 tablet, Refills: 0      STOP taking these medications     cephALEXin (KEFLEX) 500 MG capsule      naproxen (NAPROSYN) 500 MG tablet      sulfamethoxazole-trimethoprim (SEPTRA DS) 800-160 MG per tablet  acetaminophen-codeine (TYLENOL #3) 300-30 MG per tablet      amoxicillin (AMOXIL) 500 MG capsule        Allergies  Allergen Reactions  . Toradol [Ketorolac Tromethamine]     Patient stated at [facility] had same redness and pain at injection site with Toradol as he reacted with vicodin and tramadol.  Placed on allergy list  . Hydrocodone Nausea Only and Rash  . Tramadol Nausea Only and Rash   Follow-up Information    Follow up with [facility] On 05/01/2014.   Specialty:  Occupational Therapy   Why:  8:30 am   Contact information:   [address] [place] [address] [phone]        The results of significant diagnostics from this hospitalization (including imaging,  microbiology, ancillary and laboratory) are listed below for reference.    Significant Diagnostic Studies: Dg Forearm Left  04/24/2014   CLINICAL DATA:  Left wrist injury, punched a brick wall last night  EXAM: LEFT FOREARM - 2 VIEW  COMPARISON:  None.  FINDINGS: Two views of the left forearm submitted. No acute fracture or subluxation. No radiopaque foreign body.  IMPRESSION: Negative.   Electronically Signed   By: Natasha Mead M.D.   On: 04/24/2014 11:49   Dg Wrist Complete Left  04/24/2014   CLINICAL DATA:  Wrist injury, punched a brick wall last night  EXAM: LEFT WRIST - COMPLETE 3+ VIEW  COMPARISON:  None.  FINDINGS: Four views of the left wrist submitted. No acute fracture or subluxation. No radiopaque foreign body. Probable old fracture of the tip of ulnar styloid.  IMPRESSION: No acute fracture or subluxation.  No radiopaque foreign body.   Electronically Signed   By: Natasha Mead M.D.   On: 04/24/2014 11:49    Microbiology: No results found for this or any previous visit (from the past 240 hour(s)).   Labs: Basic Metabolic Panel:  Recent Labs Lab 04/26/14 1637 04/27/14 0611 04/28/14 0614  NA 137 132* 134*  K 3.4* 3.1* 3.3*  CL 103 103 103  CO2 GLUCOSE 152* 135* 108*  BUN CREATININE 1.32 1.20 1.00  CALCIUM 8.3* 7.9* 8.2*   Liver Function Tests:  Recent Labs Lab 04/27/14 0607  AST 28  ALT 45  ALKPHOS 87  BILITOT 0.4  PROT 6.0  ALBUMIN 2.8*   No results for input(s): LIPASE, AMYLASE in the last 168 hours. No results for input(s): AMMONIA in the last 168 hours. CBC:  Recent Labs Lab 04/26/14 1637 04/27/14 0611 04/28/14 0614  WBC 14.2* 11.5* 9.5  NEUTROABS 12.5*  --   --   HGB 13.8 11.9* 11.4*  HCT 40.5 34.7* 33.9*  MCV 88.6 88.5 89.7  PLT 149* 143* 162   Cardiac Enzymes: No results for input(s): CKTOTAL, CKMB, CKMBINDEX, TROPONINI in the last 168 hours. BNP: BNP (last 3 results) No results for input(s): BNP in the last 8760  hours.  ProBNP (last 3 results) No results for input(s): PROBNP in the last 8760 hours.  CBG: No results for input(s): GLUCAP in the last 168 hours.     SignedKathlen Mody  [facility] 04/29/2014, 12:33 PM

## 2016-04-22 IMAGING — CR DG WRIST COMPLETE 3+V*L*
4 series · 4 of 4 positions shown · non-contrast
Comparison: None.

CLINICAL DATA: Wrist injury, punched a brick wall last night

EXAM:
LEFT WRIST - COMPLETE 3+ VIEW

[x wrist lat left]
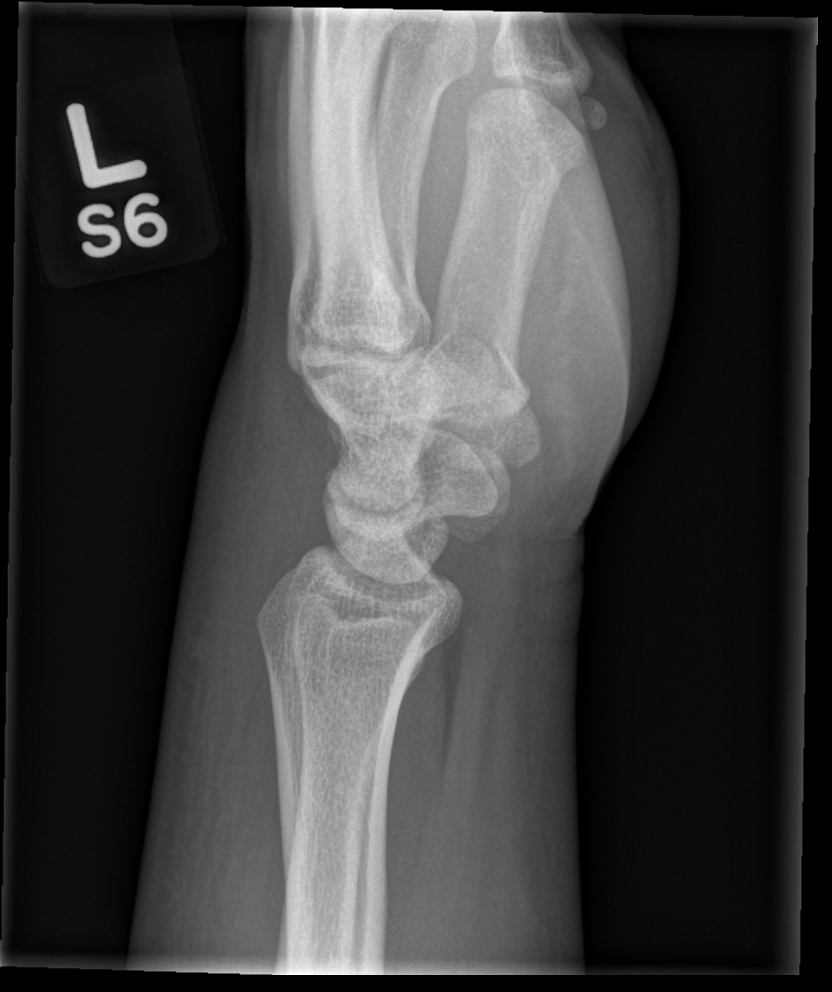

[x wrist pa left]
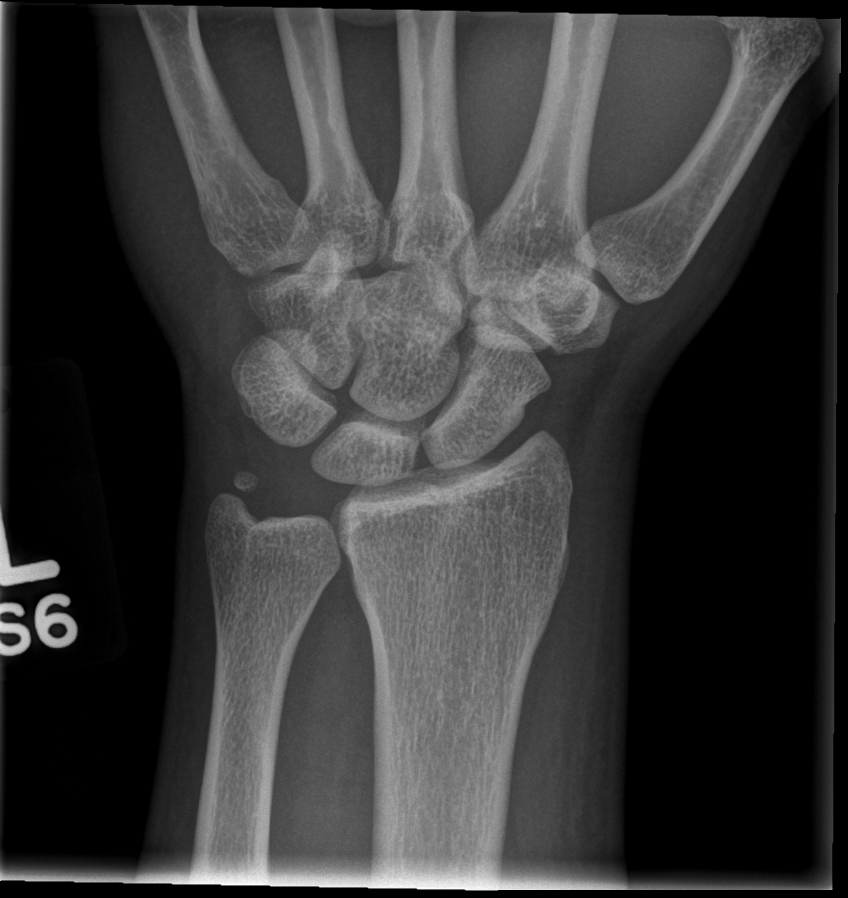

[x wrist obl left]
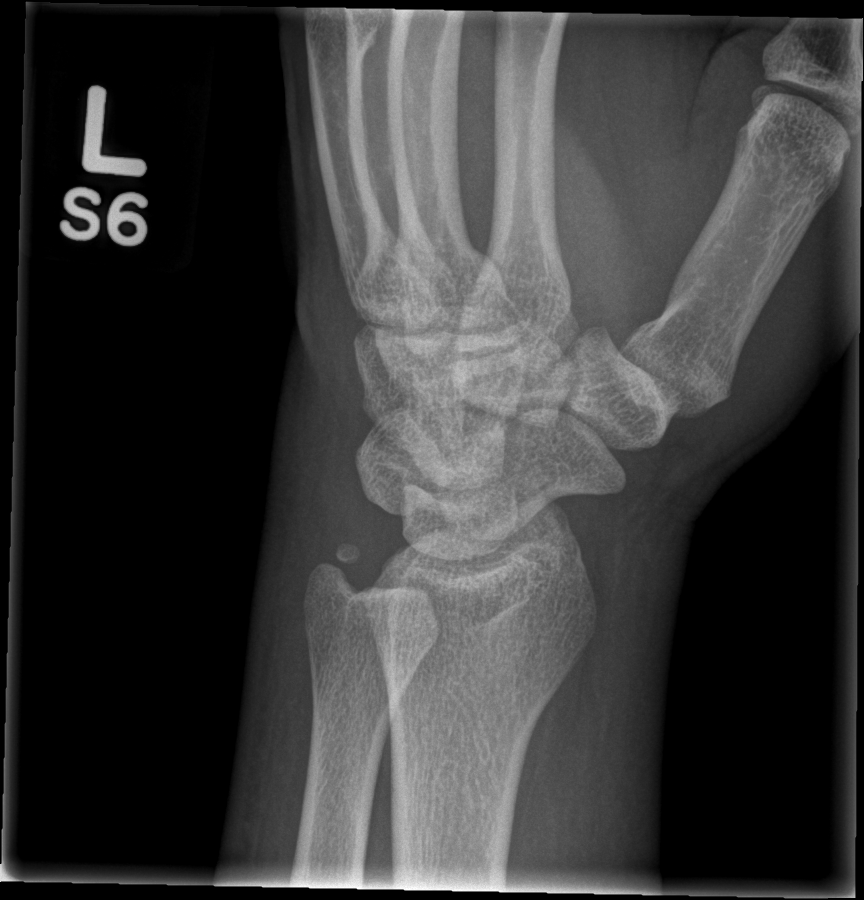

[x wrist navicular view left]
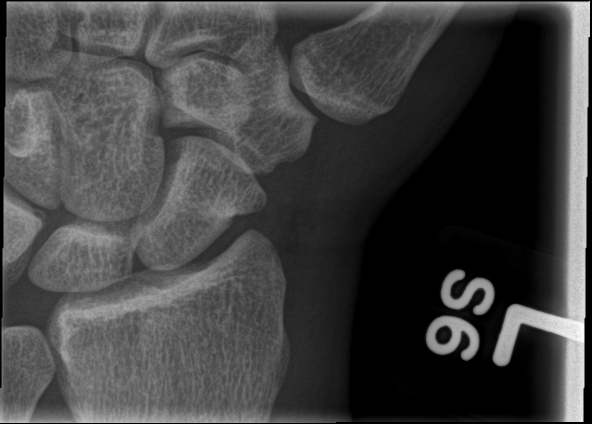

[4 of 4 positions shown; findings below may reference images not displayed]

FINDINGS: Four views of the left wrist submitted. No acute fracture or
subluxation. No radiopaque foreign body. Probable old fracture of
the tip of ulnar styloid.
IMPRESSION: No acute fracture or subluxation.  No radiopaque foreign body.

## 2016-04-22 IMAGING — CR DG FOREARM 2V*L*
2 series · 2 of 2 positions shown · non-contrast
Comparison: None.

CLINICAL DATA: Left wrist injury, punched a brick wall last night

EXAM:
LEFT FOREARM - 2 VIEW

[x forearm ap left]
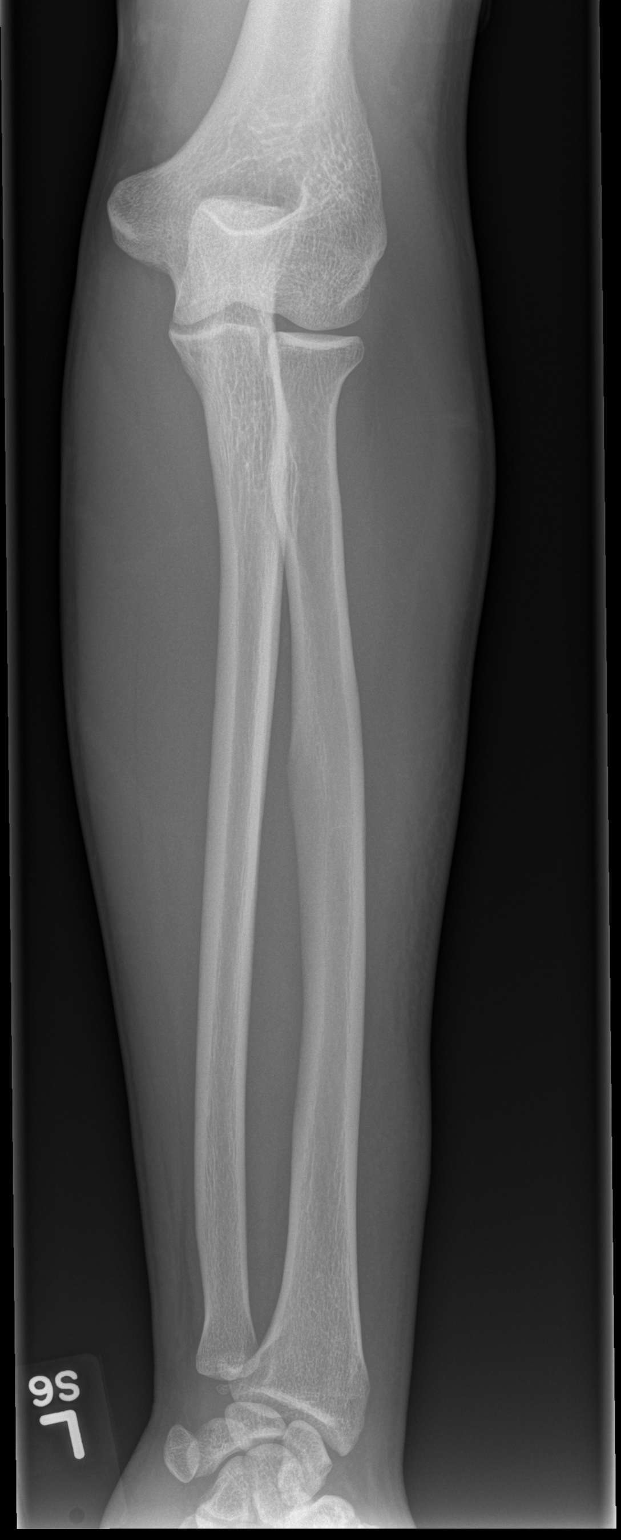

[x forearm lat left]
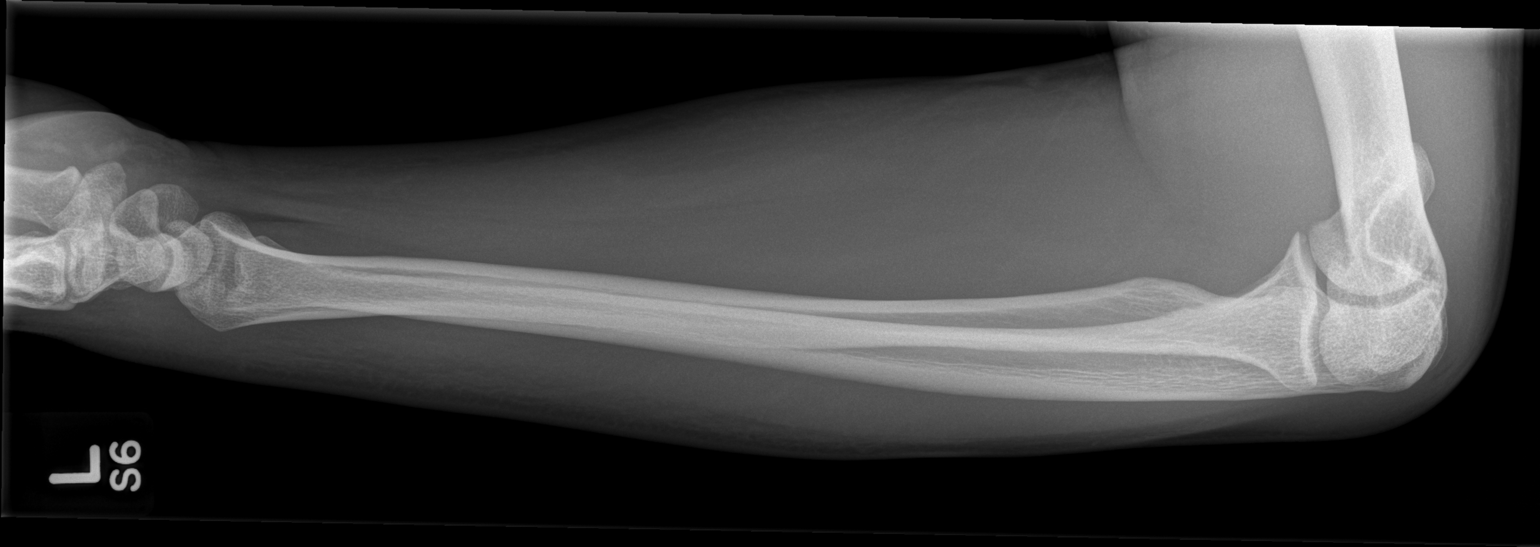

[2 of 2 positions shown; findings below may reference images not displayed]

FINDINGS: Two views of the left forearm submitted. No acute fracture or
subluxation. No radiopaque foreign body.
IMPRESSION: Negative.
